# Patient Record
Sex: Male | Born: 1980 | Race: White | Hispanic: No | Marital: Single | Smoking: Current every day smoker
Health system: Southern US, Community
[De-identification: ages and names within clinical notes are randomized; demographics above are authoritative.]

## PROBLEM LIST (undated history)

## (undated) DIAGNOSIS — J45909 Unspecified asthma, uncomplicated: Secondary | ICD-10-CM

## (undated) DIAGNOSIS — F319 Bipolar disorder, unspecified: Secondary | ICD-10-CM

## (undated) DIAGNOSIS — F1910 Other psychoactive substance abuse, uncomplicated: Secondary | ICD-10-CM

## (undated) HISTORY — PX: OTHER SURGICAL HISTORY: SHX169

---
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
## 2002-02-17 ENCOUNTER — Emergency Department (HOSPITAL_COMMUNITY): Admission: EM | Admit: 2002-02-17 | Discharge: 2002-02-17 | Payer: Self-pay | Admitting: Emergency Medicine

## 2002-02-17 ENCOUNTER — Encounter: Payer: Self-pay | Admitting: Emergency Medicine

## 2002-09-16 ENCOUNTER — Emergency Department (HOSPITAL_COMMUNITY): Admission: EM | Admit: 2002-09-16 | Discharge: 2002-09-17 | Payer: Self-pay | Admitting: Emergency Medicine

## 2002-09-22 ENCOUNTER — Emergency Department (HOSPITAL_COMMUNITY): Admission: EM | Admit: 2002-09-22 | Discharge: 2002-09-23 | Payer: Self-pay | Admitting: Emergency Medicine

## 2002-09-22 ENCOUNTER — Encounter: Payer: Self-pay | Admitting: Emergency Medicine

## 2002-10-02 ENCOUNTER — Emergency Department (HOSPITAL_COMMUNITY): Admission: EM | Admit: 2002-10-02 | Discharge: 2002-10-03 | Payer: Self-pay | Admitting: Emergency Medicine

## 2002-10-03 ENCOUNTER — Encounter: Payer: Self-pay | Admitting: Dentistry

## 2003-05-14 ENCOUNTER — Emergency Department (HOSPITAL_COMMUNITY): Admission: AD | Admit: 2003-05-14 | Discharge: 2003-05-14 | Payer: Self-pay | Admitting: Family Medicine

## 2004-04-29 ENCOUNTER — Emergency Department (HOSPITAL_COMMUNITY): Admission: EM | Admit: 2004-04-29 | Discharge: 2004-04-29 | Payer: Self-pay

## 2004-05-18 ENCOUNTER — Ambulatory Visit: Payer: Self-pay | Admitting: Family Medicine

## 2004-08-11 ENCOUNTER — Emergency Department (HOSPITAL_COMMUNITY): Admission: EM | Admit: 2004-08-11 | Discharge: 2004-08-12 | Payer: Self-pay | Admitting: Emergency Medicine

## 2004-12-29 ENCOUNTER — Emergency Department (HOSPITAL_COMMUNITY): Admission: EM | Admit: 2004-12-29 | Discharge: 2004-12-30 | Payer: Self-pay | Admitting: Emergency Medicine

## 2005-01-03 ENCOUNTER — Ambulatory Visit: Payer: Self-pay | Admitting: Family Medicine

## 2005-03-28 ENCOUNTER — Emergency Department (HOSPITAL_COMMUNITY): Admission: EM | Admit: 2005-03-28 | Discharge: 2005-03-28 | Payer: Self-pay | Admitting: Emergency Medicine

## 2005-04-21 ENCOUNTER — Emergency Department (HOSPITAL_COMMUNITY): Admission: EM | Admit: 2005-04-21 | Discharge: 2005-04-21 | Payer: Self-pay | Admitting: Emergency Medicine

## 2006-04-10 ENCOUNTER — Inpatient Hospital Stay (HOSPITAL_COMMUNITY): Admission: RE | Admit: 2006-04-10 | Discharge: 2006-04-14 | Payer: Self-pay | Admitting: Psychiatry

## 2006-04-10 ENCOUNTER — Ambulatory Visit: Payer: Self-pay | Admitting: Psychiatry

## 2010-03-22 ENCOUNTER — Emergency Department (HOSPITAL_COMMUNITY): Admission: EM | Admit: 2010-03-22 | Discharge: 2010-03-22 | Payer: Self-pay | Admitting: Emergency Medicine

## 2010-08-18 LAB — DIFFERENTIAL
Basophils Absolute: 0 10*3/uL (ref 0.0–0.1)
Basophils Relative: 0 % (ref 0–1)
Eosinophils Absolute: 0.1 10*3/uL (ref 0.0–0.7)
Eosinophils Relative: 2 % (ref 0–5)
Lymphocytes Relative: 9 % — ABNORMAL LOW (ref 12–46)
Lymphs Abs: 0.7 10*3/uL (ref 0.7–4.0)
Monocytes Absolute: 0.5 10*3/uL (ref 0.1–1.0)
Monocytes Relative: 7 % (ref 3–12)
Neutro Abs: 6.6 10*3/uL (ref 1.7–7.7)
Neutrophils Relative %: 83 % — ABNORMAL HIGH (ref 43–77)

## 2010-08-18 LAB — CBC
HCT: 40.2 % (ref 39.0–52.0)
Hemoglobin: 14.5 g/dL (ref 13.0–17.0)
MCH: 31 pg (ref 26.0–34.0)
MCHC: 36.1 g/dL — ABNORMAL HIGH (ref 30.0–36.0)
MCV: 86.1 fL (ref 78.0–100.0)
Platelets: 134 10*3/uL — ABNORMAL LOW (ref 150–400)
RBC: 4.67 MIL/uL (ref 4.22–5.81)
RDW: 12.1 % (ref 11.5–15.5)
WBC: 8 10*3/uL (ref 4.0–10.5)

## 2010-08-18 LAB — BASIC METABOLIC PANEL
BUN: 14 mg/dL (ref 6–23)
CO2: 29 mEq/L (ref 19–32)
Calcium: 8.9 mg/dL (ref 8.4–10.5)
Chloride: 104 mEq/L (ref 96–112)
Creatinine, Ser: 0.91 mg/dL (ref 0.4–1.5)
GFR calc Af Amer: >60 mL/min (ref >60)
GFR calc non Af Amer: >60 mL/min (ref >60)
Glucose, Bld: 121 mg/dL — ABNORMAL HIGH (ref 70–99)
Potassium: 3.8 mEq/L (ref 3.5–5.1)
Sodium: 137 mEq/L (ref 135–145)

## 2010-08-18 LAB — RAPID STREP SCREEN (MED CTR MEBANE ONLY): Streptococcus, Group A Screen (Direct): NEGATIVE

## 2010-09-13 ENCOUNTER — Emergency Department (HOSPITAL_COMMUNITY)
Admission: EM | Admit: 2010-09-13 | Discharge: 2010-09-14 | Disposition: A | Discharge status: Home / Self Care | Payer: Self-pay | Attending: Emergency Medicine | Admitting: Emergency Medicine

## 2010-09-13 DIAGNOSIS — R002 Palpitations: Secondary | ICD-10-CM | POA: Insufficient documentation

## 2010-09-13 DIAGNOSIS — F1910 Other psychoactive substance abuse, uncomplicated: Secondary | ICD-10-CM | POA: Insufficient documentation

## 2010-09-13 DIAGNOSIS — R112 Nausea with vomiting, unspecified: Secondary | ICD-10-CM | POA: Insufficient documentation

## 2010-09-13 DIAGNOSIS — R109 Unspecified abdominal pain: Secondary | ICD-10-CM | POA: Insufficient documentation

## 2010-09-13 LAB — CBC
HCT: 48.1 % (ref 39.0–52.0)
Hemoglobin: 17.1 g/dL — ABNORMAL HIGH (ref 13.0–17.0)
MCH: 30.6 pg (ref 26.0–34.0)
MCHC: 35.6 g/dL (ref 30.0–36.0)
MCV: 86.2 fL (ref 78.0–100.0)
Platelets: 214 10*3/uL (ref 150–400)
RBC: 5.58 MIL/uL (ref 4.22–5.81)
RDW: 11.8 % (ref 11.5–15.5)
WBC: 7.9 10*3/uL (ref 4.0–10.5)

## 2010-09-13 LAB — ETHANOL: Alcohol, Ethyl (B): <5 mg/dL (ref 0–10)

## 2010-09-13 LAB — DIFFERENTIAL
Basophils Absolute: 0 10*3/uL (ref 0.0–0.1)
Basophils Relative: 0 % (ref 0–1)
Eosinophils Absolute: 0.1 10*3/uL (ref 0.0–0.7)
Eosinophils Relative: 1 % (ref 0–5)
Lymphocytes Relative: 20 % (ref 12–46)
Lymphs Abs: 1.6 10*3/uL (ref 0.7–4.0)
Monocytes Absolute: 0.4 10*3/uL (ref 0.1–1.0)
Monocytes Relative: 5 % (ref 3–12)
Neutro Abs: 5.8 10*3/uL (ref 1.7–7.7)
Neutrophils Relative %: 74 % (ref 43–77)

## 2010-09-13 LAB — COMPREHENSIVE METABOLIC PANEL
ALT: 38 U/L (ref 0–53)
AST: 28 U/L (ref 0–37)
Albumin: 4.4 g/dL (ref 3.5–5.2)
Alkaline Phosphatase: 78 U/L (ref 39–117)
BUN: 15 mg/dL (ref 6–23)
CO2: 25 mEq/L (ref 19–32)
Calcium: 9.4 mg/dL (ref 8.4–10.5)
Chloride: 104 mEq/L (ref 96–112)
Creatinine, Ser: 0.81 mg/dL (ref 0.4–1.5)
GFR calc Af Amer: >60 mL/min (ref >60)
GFR calc non Af Amer: >60 mL/min (ref >60)
Glucose, Bld: 105 mg/dL — ABNORMAL HIGH (ref 70–99)
Potassium: 4.1 mEq/L (ref 3.5–5.1)
Sodium: 138 mEq/L (ref 135–145)
Total Bilirubin: 0.8 mg/dL (ref 0.3–1.2)
Total Protein: 7.6 g/dL (ref 6.0–8.3)

## 2010-09-13 LAB — URINALYSIS, ROUTINE W REFLEX MICROSCOPIC
Bilirubin Urine: NEGATIVE
Glucose, UA: NEGATIVE mg/dL
Hgb urine dipstick: NEGATIVE
Ketones, ur: NEGATIVE mg/dL
Leukocytes, UA: NEGATIVE
Nitrite: NEGATIVE
Protein, ur: NEGATIVE mg/dL
Specific Gravity, Urine: 1.021 (ref 1.005–1.030)
Urobilinogen, UA: 0.2 mg/dL (ref 0.0–1.0)
pH: 8 (ref 5.0–8.0)

## 2010-09-13 LAB — RAPID URINE DRUG SCREEN, HOSP PERFORMED
Amphetamines: NOT DETECTED
Barbiturates: NOT DETECTED
Benzodiazepines: NOT DETECTED
Cocaine: NOT DETECTED
Opiates: POSITIVE — AB
Tetrahydrocannabinol: POSITIVE — AB

## 2010-09-13 LAB — URINE MICROSCOPIC-ADD ON

## 2010-10-22 NOTE — Discharge Summary (Signed)
NAMETAYVON, CULLEY NO.:  [id number]   MEDICAL RECORD NO.:  [id number]          PATIENT TYPE:  IPS   LOCATION:  0300                          FACILITY:  [facility]   PHYSICIAN:  Anselm Jungling, MD  DATE OF BIRTH:  May 10, 1981   DATE OF ADMISSION:  04/10/2006  DATE OF DISCHARGE:  04/14/2006                               DISCHARGE SUMMARY   IDENTIFYING DATA AND REASON FOR ADMISSION:  The patient is a 30 year old  single white male who was admitted due to severe depression, suicidal  ideation, and self-inflicted cutting.  He reported that he had been  previously diagnosed with personality disorder after having a nervous  breakdown in [place] in the recent past, at which time he had  been placed on a regimen of Seroquel.  He had been using alcohol, his  last drink having been two days prior to admission, and intravenous  heroin which he had used the week prior to admission.  Stressors  included a mother with multiple sclerosis and a girlfriend with cervical  cancer.  Please refer to the admission note for further details  pertaining to the symptoms, circumstances and history that led to his  hospitalization.  He was given initial diagnoses of alcohol and heroin  dependence, mood disorder NOS, rule out substance induced mood disorder,  and history of AXIS II disorder.   MEDICAL AND LABORATORY:  The patient was medically and physically  assessed by the psychiatric nurse practitioner.  He was essentially in  good health, but did report an upper left arm abscess that drained  periodically.  Aside from this, there were no significant medical  issues.  At the time of discharge, she was instructed to follow-up with  his primary care physician regarding wound care and any other health  issues.   HOSPITAL COURSE:  The patient was admitted to the adult inpatient  psychiatric service.  He presented as a well nourished, well developed  male who was pleasant, fully  oriented, with sad mood and affect.  He was  soft spoken, but his thoughts and speech were normally organized.  There  were no signs or symptoms of psychosis or thought disorder.  He denied  suicidal ideation and indicated that he wanted help.  He was continued  on a regimen of Seroquel at a dose of 100 mg at bedtime.  He slept well  with this, and by the third hospital day reported that although he was  feeling better, he was still somewhat depressed.  However, he had more  hope and was able to make more positive statements regarding his  treatment and the prospective of remaining clean and sober.  He focused  well on appropriate aftercare issues.  He agreed to a family session  involving his girlfriend.  This occurred on the fourth hospital day.  His girlfriend expressed concern about the patient's substance abuse,  and she reported that she and their roommates were making changes at  home to accommodate the patient's sobriety.  They discussed the  girlfriend's own medical problems, and discussed the patient's aftercare  needs.  All-in-all, the family session was positive and successful.   The patient's treatment continued until the following day at which time  he appeared to be quite appropriate for discharge.  His mood was much  improved, he was absent of thoughts of suicide or self harm, and was  strongly committed to maintaining his sobriety.   AFTERCARE:  The patient was to follow-up at the [facility] with an  appointment for medication management on April 18, 2006.  He was also  instructed to go to [facility] for an appointment  for intake on November 30.  He was instructed to follow-up with his  primary care physician regarding his skin wound and other health issues.   DISCHARGE MEDICATIONS:  Depakote ER 500 mg daily, which had been  initiated for mood stabilization, and Seroquel 100 mg q.h.s.   DISCHARGE DIAGNOSES:  AXIS I:              Polysubstance  abuse/dependence.                    Mood disorder NOS.  AXIS II:          Deferred.  AXIS III:         Skin abscess.  AXIS IV:          Stressors severe.  AXIS V:           GAF on discharge 65.      Anselm Jungling, MD  Electronically Signed     SPB/MEDQ  D:  05/17/2006  T:  05/17/2006  Job:  [id number]

## 2010-10-22 NOTE — H&P (Signed)
Mark Hunt, BATREZ NO.:  [id number]   MEDICAL RECORD NO.:  [id number]          PATIENT TYPE:  IPS   LOCATION:  0300                          FACILITY:  [facility]   PHYSICIAN:  Anselm Jungling, MD  DATE OF BIRTH:  01-26-1981   DATE OF ADMISSION:  04/10/2006  DATE OF DISCHARGE:                         PSYCHIATRIC ADMISSION ASSESSMENT   A 30 year old single white male, voluntarily admitted on April 10, 2006.   HISTORY OF PRESENT ILLNESS:  The patient presents with a history of self-  inflicted injury, suicidal thoughts.  Cut his left arm with a razor.  Patient also reports he has been drinking and using heroin IV.  His last use  of alcohol and drugs were on Saturday prior to this admission.  Patient  states that he considers himself a vagabond.  He has been living in  different surroundings.  Currently living with friends at this time.  Endorsing mood swings and racing thoughts, and feeling confused at times.  Patient also reports that he has been having some stressors, stating death  is all around him.  His girlfriend is currently ill with cervical cancer.  Patient denies any psychosis and any current withdrawal symptoms.   PAST PSYCHIATRIC HISTORY:  First admission to [facility].  Has  a history of cutting.  About 2 months ago was hospitalized in [place].   SOCIAL HISTORY:  He is a 30 year old single white male with no children,  currently living with friends.  He is unemployed and denies any legal  issues.   FAMILY HISTORY:  States his mother has mental illness, and there are also  mood disorders in the family.   ALCOHOL/DRUG HISTORY:  Patient smokes a pack a day.  He has been drinking  wine and using IV heroin.   PRIMARY CARE Mackenzy Grumbine:  None.   MEDICAL PROBLEMS:  None, although patient does report an abscess on his left  upper arm that drains periodically.   MEDICATIONS:  Seroquel 300 mg at bedtime, has been noncompliant.   DRUG ALLERGIES:  No known allergies.   REVIEW OF SYSTEMS:  Patient denies any fever, chills or changes in appetite.  Reports shortness of breath.  Smokes.  Denies any chest pain, nausea or  vomiting, or diarrhea.  SKIN:  An abscess to his left upper arm.  Reporting  depression with suicidal thoughts, with recent substance abuse.  Temperature  is 97.1, __________, blood pressure 139/80, 184 pounds.  He is 6 feet 3  inches tall.  Just a well-nourished young man, no acute distress.  Negative  lymphadenopathy.  CHEST:  Clear.  There is no wheezing.  HEART :  Rate is  regular rate and rhythm.  No murmurs, gallops, or rubs.  ABDOMEN :  Soft,  flat, nontender.  ABDOMEN/GU:  Exam is deferred.  EXTREMITIES:  The patient  moves all extremities __________.  No edema.  No clubbing.  SKIN:  The  patient has multiple tattoos to his hands and wrist.  Has multiple  superficial lacerations to his left forearm.  Also has an approximately  quarter-size area  to his left inner axilla area, non-draining, pink.  Does  not look overtly inflamed.  NEUROLOGIC:  Findings are intact and nonfocal.   Hemoglobin 17.4, hematocrit 49.5.  TSH 0.722.  CMET is within normal limits.  Urine drug screen is unavailable at this time.   MENTAL STATUS EXAMINATION:  He is fully alert, cooperative, casually  dressed.  Little eye contact.  Looks down at the floor.  Brief eye contact.  Speech is soft spoken.  Patient feels anxious and tense, and depressed.  Patient's affect is flat.  Thought process is somewhat scattered.  There,  however, does not appear to be any evidence of psychosis, endorsing suicidal  thoughts.  Cognitive function intact.  Memory is fair.  Judgment is poor,  poor historian.   Axis I:  Mood disorder, polysubstance abuse.  Axis II:  Deferred.  Axis III:  Tobacco abuse, and a lesion to left arm.  Axis IV:  Problems with primary support group, occupation, housing, and  other significant problems.  Axis V:   Current is 35.   __________ contract for safety.  We will review medication compliance.  We  will do workup in regards to IV drug use.  Check an HIV and hepatitis.  Do a  wound culture on lesion on arm.  We will continue with Seroquel and Depakote  for mood swings, anxiety.  Case manager is to address living situation.  We  will consider a family session with patient's support group.  Patient will  need to follow up with lesion on arm.  Tentative stay 5 to 6 days.      Landry Corporal, N.P.      Anselm Jungling, MD  Electronically Signed    JO/MEDQ  D:  04/12/2006  T:  04/12/2006  Job:  [id number]

## 2011-10-27 ENCOUNTER — Emergency Department (HOSPITAL_COMMUNITY): Payer: Self-pay

## 2011-10-27 ENCOUNTER — Emergency Department (HOSPITAL_COMMUNITY)
Admission: EM | Admit: 2011-10-27 | Discharge: 2011-10-27 | Disposition: A | Discharge status: Home / Self Care | Payer: Self-pay | Attending: Emergency Medicine | Admitting: Emergency Medicine

## 2011-10-27 ENCOUNTER — Encounter (HOSPITAL_COMMUNITY): Payer: Self-pay | Admitting: *Deleted

## 2011-10-27 DIAGNOSIS — IMO0001 Reserved for inherently not codable concepts without codable children: Secondary | ICD-10-CM | POA: Insufficient documentation

## 2011-10-27 DIAGNOSIS — J40 Bronchitis, not specified as acute or chronic: Secondary | ICD-10-CM | POA: Insufficient documentation

## 2011-10-27 MED ORDER — ALBUTEROL SULFATE (5 MG/ML) 0.5% IN NEBU
5.0000 mg | INHALATION_SOLUTION | Freq: Once | RESPIRATORY_TRACT | Status: AC
  Administered 2011-10-27: 5 mg via RESPIRATORY_TRACT
  Filled 2011-10-27: qty 1

## 2011-10-27 MED ORDER — ALBUTEROL SULFATE HFA 108 (90 BASE) MCG/ACT IN AERS
2.0000 | INHALATION_SPRAY | RESPIRATORY_TRACT | Status: DC | PRN
  Administered 2011-10-27: 2 via RESPIRATORY_TRACT
  Filled 2011-10-27: qty 6.7

## 2011-10-27 MED ORDER — ACETAMINOPHEN 325 MG PO TABS
975.0000 mg | ORAL_TABLET | Freq: Once | ORAL | Status: AC
  Administered 2011-10-27: 975 mg via ORAL
  Filled 2011-10-27: qty 3

## 2011-10-27 NOTE — Discharge Instructions (Signed)
Bronchitis Stop smoking, as smoking makes her prone to bronchitis. Use your inhaler 2 puffs every 4 hours as needed for shortness of breath or cough. Take Tylenol for aches. Call [facility] tomorrow to get a primary care Doctor Bronchitis is a problem of the air tubes leading to your lungs. This problem makes it hard for air to get in and out of the lungs. You may cough a lot because your air tubes are narrow. Going without care can cause lasting (chronic) bronchitis. HOME CARE   Drink enough fluids to keep your pee (urine) clear or pale yellow.   Use a cool mist humidifier.   Quit smoking if you smoke. If you keep smoking, the bronchitis might not get better.   Only take medicine as told by your doctor.  GET HELP RIGHT AWAY IF:   Coughing keeps you awake.   You start to wheeze.   You become more sick or weak.   You have a hard time breathing or get short of breath.   You cough up blood.   Coughing lasts more than 2 weeks.   You have a fever.   Your baby is older than 3 months with a rectal temperature of 102 F (38.9 C) or higher.   Your baby is 37 months old or younger with a rectal temperature of 100.4 F (38 C) or higher.  MAKE SURE YOU:  Understand these instructions.   Will watch your condition.   Will get help right away if you are not doing well or get worse.  Document Released: 11/09/2007 Document Revised: 05/12/2011 Document Reviewed: 04/24/2009 [facility] Patient Information 2012 [place], [place].

## 2011-10-27 NOTE — ED Notes (Signed)
Pt is here with head adn chest congestion and right ear fullness.  Pt reports fatigue.  Been going on for weeks

## 2011-10-27 NOTE — ED Provider Notes (Addendum)
History  This chart was scribed for Doug Sou, MD by Bennett Scrape. This patient was seen in room STRE6/STRE6 and the patient's care was started at 3:46PM.  CSN: [id number]  Arrival date & time 10/27/11  1409   First MD Initiated Contact with Patient 10/27/11 1546      Chief Complaint  Patient presents with  . Cough  . Generalized Body Aches    Patient is a 31 y.o. male presenting with cough. The history is provided by the patient. No language interpreter was used.  Cough This is a new problem. The current episode started more than 1 week ago. The problem occurs constantly. The problem has not changed since onset.The cough is non-productive. There has been no fever. Associated symptoms include sore throat and shortness of breath. Pertinent negatives include no chills and no ear pain. He has tried nothing for the symptoms. He is a smoker. His past medical history is significant for asthma.     Mark Hunt is a 31 y.o. male who presents to the Emergency Department complaining of 2 weeks gradual onset, constant non-productive cough with associated generalized body aches, chest congestion, sore throat and emesis. Emesis is worse at night.  He reports that last episode of emesis being two days ago. The sore throat is worse with coughing. Pt reports that he is currently SOB. He reports taking ibuprofen and mucinex with mild improvement in symptoms. He denies fever, abdominal pain and diarrhea as associated symptoms. He has a h/o asthma. He is a current everyday smoker but denies alcohol use.  No PCP.    Past medical history: Asthma-Per pt by bedside  History reviewed. No pertinent past surgical history.  No family history on file.  History  Substance Use Topics  . Smoking status: Current Everyday Smoker-E cigarettes and marijuana occasioanlly  . Smokeless tobacco: Not on file  . Alcohol Use: No      Review of Systems  Constitutional: Negative for fever and chills.  HENT:  Positive for congestion and sore throat. Negative for ear pain.   Respiratory: Positive for cough and shortness of breath.   Gastrointestinal: Positive for nausea and vomiting. Negative for abdominal pain and diarrhea.  All other systems reviewed and are negative.    Allergies  Review of patient's allergies indicates no known allergies.  Home Medications   Current Outpatient Rx  Name Route Sig Dispense Refill  . GUAIFENESIN ER 600 MG PO TB12 Oral Take 600 mg by mouth once.    . ADULT MULTIVITAMIN W/MINERALS CH Oral Take 1 tablet by mouth daily.      Triage Vitals: BP 111/76  Pulse 90  Temp(Src) 98.3 F (36.8 C) (Oral)  Resp 16  SpO2 96%  Physical Exam  Nursing note and vitals reviewed. Constitutional: He is oriented to person, place, and time. He appears well-developed and well-nourished. No distress.  HENT:  Head: Normocephalic and atraumatic.       Nasal congestion, oropharynx is erythematous, TMs are normal bilaterally, uvula is midline  Eyes: EOM are normal.  Neck: Neck supple. No tracheal deviation present.  Cardiovascular: Normal rate.   Pulmonary/Chest: Effort normal. No respiratory distress. He has no wheezes. He has no rales.       Diffuse scant rhonchi  Abdominal: Soft. There is no tenderness.  Musculoskeletal: Normal range of motion.  Neurological: He is alert and oriented to person, place, and time.  Skin: Skin is warm and dry.  Psychiatric: He has a normal mood and affect. His  behavior is normal.    ED Course  Procedures (including critical care time)  DIAGNOSTIC STUDIES: Oxygen Saturation is 96% on room air, adequate by my interpretation.   Breathing improved after treatment with albuterol nebulizer. At 5 PM patient sitting up in bed reading a book, no distress COORDINATION OF CARE: 3:53PM-Discussed treatment plan which includes tylenol and breathing treatment with pt and pt agreed to plan. 5:01PM-Discussed discharge plan with pt and pt agreed. Advised  pt to quit smoking. Will proved a referral to a PCP in the [place] area for the pt to follow up with.   Labs Reviewed - No data to display Dg Chest 2 View  10/27/2011  *RADIOLOGY REPORT*  Clinical Data: Chest pain and cough.  Myalgias.  Smoker.  Asthma.  CHEST - 2 VIEW  Comparison: 03/22/2010.  Findings: Normal sized heart.  Clear lungs.  Stable mild central peribronchial thickening.  Unremarkable bones.  IMPRESSION: Stable mild chronic bronchitic changes.  No acute abnormality.  Original Report Authenticated By: Darrol Gaudin, M.D.   X-ray reviewed by me  No diagnosis found.    MDM  Plan smoking cessation encouraged(spent 5 minutes counseling patient on benefits of smoking cessation) Albuterol HFA to go Referral [facility] Tylenol for aches Diagnosis #1 bronchitis #2 tobacco abuse       I personally performed the services described in this documentation, which was scribed in my presence. The recorded information has been reviewed and considered.   Doug Sou, MD 10/27/11 1708  Doug Sou, MD 10/27/11 1710

## 2011-10-27 NOTE — ED Notes (Signed)
St's feels better after breathing tx

## 2012-09-06 ENCOUNTER — Encounter (HOSPITAL_COMMUNITY): Payer: Self-pay | Admitting: *Deleted

## 2012-09-06 ENCOUNTER — Emergency Department (HOSPITAL_COMMUNITY)
Admission: EM | Admit: 2012-09-06 | Discharge: 2012-09-06 | Disposition: A | Discharge status: Home / Self Care | Payer: Self-pay | Attending: Emergency Medicine | Admitting: Emergency Medicine

## 2012-09-06 DIAGNOSIS — K0889 Other specified disorders of teeth and supporting structures: Secondary | ICD-10-CM

## 2012-09-06 DIAGNOSIS — F17200 Nicotine dependence, unspecified, uncomplicated: Secondary | ICD-10-CM | POA: Insufficient documentation

## 2012-09-06 DIAGNOSIS — R748 Abnormal levels of other serum enzymes: Secondary | ICD-10-CM

## 2012-09-06 DIAGNOSIS — R599 Enlarged lymph nodes, unspecified: Secondary | ICD-10-CM | POA: Insufficient documentation

## 2012-09-06 DIAGNOSIS — K089 Disorder of teeth and supporting structures, unspecified: Secondary | ICD-10-CM | POA: Insufficient documentation

## 2012-09-06 DIAGNOSIS — F319 Bipolar disorder, unspecified: Secondary | ICD-10-CM | POA: Insufficient documentation

## 2012-09-06 DIAGNOSIS — J351 Hypertrophy of tonsils: Secondary | ICD-10-CM | POA: Insufficient documentation

## 2012-09-06 DIAGNOSIS — Z79899 Other long term (current) drug therapy: Secondary | ICD-10-CM | POA: Insufficient documentation

## 2012-09-06 DIAGNOSIS — H9209 Otalgia, unspecified ear: Secondary | ICD-10-CM | POA: Insufficient documentation

## 2012-09-06 DIAGNOSIS — K006 Disturbances in tooth eruption: Secondary | ICD-10-CM | POA: Insufficient documentation

## 2012-09-06 HISTORY — DX: Bipolar disorder, unspecified: F31.9

## 2012-09-06 LAB — COMPREHENSIVE METABOLIC PANEL WITH GFR
ALT: 92 U/L — ABNORMAL HIGH (ref 0–53)
BUN: 17 mg/dL (ref 6–23)
CO2: 28 meq/L (ref 19–32)
Calcium: 9.4 mg/dL (ref 8.4–10.5)
Creatinine, Ser: 0.8 mg/dL (ref 0.50–1.35)
GFR calc Af Amer: >90 mL/min (ref >90)
GFR calc non Af Amer: >90 mL/min (ref >90)
Glucose, Bld: 101 mg/dL — ABNORMAL HIGH (ref 70–99)

## 2012-09-06 LAB — COMPREHENSIVE METABOLIC PANEL
AST: 70 U/L — ABNORMAL HIGH (ref 0–37)
Albumin: 3.7 g/dL (ref 3.5–5.2)
Alkaline Phosphatase: 77 U/L (ref 39–117)
Chloride: 98 mEq/L (ref 96–112)
Potassium: 4 mEq/L (ref 3.5–5.1)
Sodium: 135 mEq/L (ref 135–145)
Total Bilirubin: 0.3 mg/dL (ref 0.3–1.2)
Total Protein: 7.3 g/dL (ref 6.0–8.3)

## 2012-09-06 LAB — CBC
HCT: 46.8 % (ref 39.0–52.0)
Hemoglobin: 16.6 g/dL (ref 13.0–17.0)
MCH: 30.1 pg (ref 26.0–34.0)
MCHC: 35.5 g/dL (ref 30.0–36.0)
MCV: 84.9 fL (ref 78.0–100.0)
Platelets: 186 10*3/uL (ref 150–400)
RBC: 5.51 MIL/uL (ref 4.22–5.81)
RDW: 12.7 % (ref 11.5–15.5)
WBC: 6.5 10*3/uL (ref 4.0–10.5)

## 2012-09-06 MED ORDER — PENICILLIN V POTASSIUM 500 MG PO TABS: 500.0000 mg | ORAL_TABLET | Freq: Four times a day (QID) | ORAL | Status: DC

## 2012-09-06 MED ORDER — TRAMADOL HCL 50 MG PO TABS
50.0000 mg | ORAL_TABLET | Freq: Once | ORAL | Status: AC
  Administered 2012-09-06: 50 mg via ORAL
  Filled 2012-09-06: qty 1

## 2012-09-06 MED ORDER — TRAMADOL HCL 50 MG PO TABS: 50.0000 mg | ORAL_TABLET | Freq: Four times a day (QID) | ORAL | Status: DC | PRN

## 2012-09-06 NOTE — Discharge Instructions (Signed)

## 2012-09-06 NOTE — ED Provider Notes (Signed)
Medical screening examination/treatment/procedure(s) were performed by non-physician practitioner and as supervising physician I was immediately available for consultation/collaboration.   Mark Hunt. Oletta Lamas, MD 09/06/12 2215

## 2012-09-06 NOTE — ED Provider Notes (Signed)
History    This chart was scribed for non-physician practitioner working with Mark Hunt. Mark Lamas, MD by Mark Hunt, ED Scribe. This patient was seen in room WLCON/WLCON and the patient's care was started at 4:17PM.   CSN: [id number]  Arrival date & time 09/06/12  1602   First MD Initiated Contact with Patient 09/06/12 1617      Chief Complaint  Patient presents with  . Medical Clearance  . Dental Pain    (Consider location/radiation/quality/duration/timing/severity/associated sxs/prior treatment) Patient is a 32 y.o. male presenting with tooth pain. The history is provided by the patient. No language interpreter was used.  Dental PainThe primary symptoms include mouth pain. Primary symptoms do not include fever, shortness of breath or sore throat. The symptoms began more than 1 week ago (2 weeks ago). The symptoms are worsening. The symptoms are new. The symptoms occur constantly.  Mouth pain began more than 1 week (2 weeks ago) ago. Mouth pain occurs constantly. Mouth pain is worsening. Affected locations include: teeth.  Additional symptoms include: dental sensitivity to temperature and ear pain.    Mark Hunt is a 32 y.o. male , with a hx of bipolar 1 disorder, who presents to the Emergency Department complaining of dental pain located at the left lower jaw, radiating downwards, onset two weeks ago. The pt reports he has been experiencing intermittently occuring, dental pain, located at the left lower jaw, for the past two weeks. Associated symptoms include otalgia located at the right ear, onset two weeks ago. The pt characterizes his dental pain as a throbbing sensation, intensified by exposure to cold liquids.  The pt denies fever, abdominal pain, nausea, vomiting, chest pain, and difficulty breathing.   The pt is a current everyday smoker, however, he does not drink alcohol.      Past Medical History  Diagnosis Date  . Bipolar 1 disorder     History reviewed. No pertinent past  surgical history.  No family history on file.  History  Substance Use Topics  . Smoking status: Current Every Day Smoker  . Smokeless tobacco: Not on file  . Alcohol Use: No      Review of Systems  Constitutional: Negative for fever.  HENT: Positive for ear pain and dental problem. Negative for sore throat.   Respiratory: Negative for shortness of breath.   Cardiovascular: Negative for chest pain.  Gastrointestinal: Negative for nausea, vomiting and abdominal pain.  All other systems reviewed and are negative.    Allergies  Review of patient's allergies indicates no known allergies.  Home Medications   Current Outpatient Rx  Name  Route  Sig  Dispense  Refill  . ibuprofen (ADVIL,MOTRIN) 200 MG tablet   Oral   Take 600 mg by mouth every 8 (eight) hours as needed for pain.         . Multiple Vitamin (MULITIVITAMIN WITH MINERALS) TABS   Oral   Take 1 tablet by mouth daily.         . Pseudoephedrine-APAP-DM (DAYQUIL PO)   Oral   Take 15 mLs by mouth every 6 (six) hours as needed (for cold symptoms.).         Marland Kitchen QUEtiapine (SEROQUEL) 200 MG tablet   Oral   Take 200 mg by mouth at bedtime.           BP 130/72  Pulse 84  Temp(Src) 98.7 F (37.1 C) (Oral)  Resp 16  SpO2 98%  Physical Exam  Nursing note and vitals reviewed. Constitutional:  He is oriented to person, place, and time. He appears well-developed and well-nourished. No distress.  HENT:  Head: Normocephalic and atraumatic. No trismus in the jaw.  Right Ear: Hearing, tympanic membrane, external ear and ear canal normal.  Left Ear: Hearing, tympanic membrane, external ear and ear canal normal.  Nose: Nose normal.  Mouth/Throat: Abnormal dentition. No dental abscesses.  Missing half of left lower molar. Gum inflamed, Tender to palpitation. Right tonsil markedly enlarged.  around the missing molar, no obvious signs of abscess, no submental edema, tongue elevation, nor trismus.   Eyes: Conjunctivae  are normal.  Neck: Normal range of motion. No tracheal deviation present.  Cardiovascular: Normal rate, regular rhythm and normal heart sounds.   Pulmonary/Chest: Effort normal and breath sounds normal. No stridor.  Abdominal: Soft. He exhibits no distension. There is no tenderness.  Musculoskeletal: Normal range of motion.  Lymphadenopathy:    He has cervical adenopathy.  Neurological: He is alert and oriented to person, place, and time.  Skin: Skin is warm and dry. He is not diaphoretic.  Psychiatric: His behavior is normal. His affect is blunt.    ED Course  Procedures (including critical care time)  DIAGNOSTIC STUDIES: Oxygen Saturation is 98% on room air, normal by my interpretation.    COORDINATION OF CARE:  4:22 PM- Treatment plan concerning evaluation by attending Physician and follow up with Dentist discussed with patient. Pt agrees with treatment.   4:44 PM- Recheck. Treatment plan concerning laboratory evaluation, administration of penicillin, and follow up with a dentist discussed with patient. Pt agrees with treatment.     Results for orders placed during the hospital encounter of 09/06/12  CBC      Result Value Range   WBC 6.5  4.0 - 10.5 K/uL   RBC 5.51  4.22 - 5.81 MIL/uL   Hemoglobin 16.6  13.0 - 17.0 g/dL   HCT 40.9  81.1 - 91.4 %   MCV 84.9  78.0 - 100.0 fL   MCH 30.1  26.0 - 34.0 pg   MCHC 35.5  30.0 - 36.0 g/dL   RDW 78.2  95.6 - 21.3 %   Platelets 186  150 - 400 K/uL  COMPREHENSIVE METABOLIC PANEL      Result Value Range   Sodium 135  135 - 145 mEq/L   Potassium 4.0  3.5 - 5.1 mEq/L   Chloride 98  96 - 112 mEq/L   CO2 28  19 - 32 mEq/L   Glucose, Bld 101 (*) 70 - 99 mg/dL   BUN 17  6 - 23 mg/dL   Creatinine, Ser 0.86  0.50 - 1.35 mg/dL   Calcium 9.4  8.4 - 57.8 mg/dL   Total Protein 7.3  6.0 - 8.3 g/dL   Albumin 3.7  3.5 - 5.2 g/dL   AST 70 (*) 0 - 37 U/L   ALT 92 (*) 0 - 53 U/L   Alkaline Phosphatase 77  39 - 117 U/L   Total Bilirubin 0.3   0.3 - 1.2 mg/dL   GFR calc non Af Amer >90  >90 mL/min   GFR calc Af Amer >90  >90 mL/min      No results found.   1. Pain, dental   2. Elevated liver enzymes       MDM  Patient presents today from [facility] with worsening dental pain over 2 weeks. Missing half his left lower molar with left lower gum irritation. No obvious dental abscess. States he lost half his  tooth 3 years ago. Markedly enlarged right tonsil. States this is also chronic. No signs of impending airway obstruction. Covered with penicillin. Follow up with dentist - referral to dentist on call given. Mild elevation of liver enzymes. This can be followed up and does not need to be addressed today. Vitals signs stable for discharge. Will return to [facility]. Return precautions given.      I personally performed the services described in this documentation, which was scribed in my presence. The recorded information has been reviewed and is accurate.   Mora Bellman, PA-C 09/06/12 1810

## 2012-09-06 NOTE — Progress Notes (Signed)
CM spoke with pt who confirms self pay [facility] resident with no pcp. Pending medical clearance

## 2012-09-06 NOTE — ED Notes (Signed)
Pt sent from [facility] d/t tooth pain radiating through mouth and down jaw x 2 weeks, states [facility] does have common antibiotics and pain medications, also requesting pt have chem panel and CBC. Pt to return to [facility] once seen.

## 2012-09-16 ENCOUNTER — Emergency Department (HOSPITAL_COMMUNITY): Payer: Self-pay

## 2012-09-16 ENCOUNTER — Emergency Department (HOSPITAL_COMMUNITY)
Admission: EM | Admit: 2012-09-16 | Discharge: 2012-09-16 | Disposition: A | Discharge status: Home / Self Care | Payer: Self-pay | Attending: Emergency Medicine | Admitting: Emergency Medicine

## 2012-09-16 ENCOUNTER — Encounter (HOSPITAL_COMMUNITY): Payer: Self-pay | Admitting: *Deleted

## 2012-09-16 DIAGNOSIS — Z792 Long term (current) use of antibiotics: Secondary | ICD-10-CM | POA: Insufficient documentation

## 2012-09-16 DIAGNOSIS — H53149 Visual discomfort, unspecified: Secondary | ICD-10-CM | POA: Insufficient documentation

## 2012-09-16 DIAGNOSIS — Z79899 Other long term (current) drug therapy: Secondary | ICD-10-CM | POA: Insufficient documentation

## 2012-09-16 DIAGNOSIS — Z5900 Homelessness unspecified: Secondary | ICD-10-CM | POA: Insufficient documentation

## 2012-09-16 DIAGNOSIS — F319 Bipolar disorder, unspecified: Secondary | ICD-10-CM | POA: Insufficient documentation

## 2012-09-16 DIAGNOSIS — R51 Headache: Secondary | ICD-10-CM | POA: Insufficient documentation

## 2012-09-16 DIAGNOSIS — F17200 Nicotine dependence, unspecified, uncomplicated: Secondary | ICD-10-CM | POA: Insufficient documentation

## 2012-09-16 DIAGNOSIS — H538 Other visual disturbances: Secondary | ICD-10-CM | POA: Insufficient documentation

## 2012-09-16 HISTORY — DX: Other psychoactive substance abuse, uncomplicated: F19.10

## 2012-09-16 MED ORDER — KETOROLAC TROMETHAMINE 30 MG/ML IJ SOLN
30.0000 mg | Freq: Once | INTRAMUSCULAR | Status: AC
  Administered 2012-09-16: 30 mg via INTRAVENOUS
  Filled 2012-09-16: qty 1

## 2012-09-16 MED ORDER — DIPHENHYDRAMINE HCL 50 MG/ML IJ SOLN
25.0000 mg | Freq: Once | INTRAMUSCULAR | Status: AC
  Administered 2012-09-16: 25 mg via INTRAVENOUS
  Filled 2012-09-16: qty 1

## 2012-09-16 MED ORDER — METOCLOPRAMIDE HCL 5 MG/ML IJ SOLN
10.0000 mg | Freq: Once | INTRAMUSCULAR | Status: AC
  Administered 2012-09-16: 10 mg via INTRAVENOUS
  Filled 2012-09-16: qty 2

## 2012-09-16 MED ORDER — SODIUM CHLORIDE 0.9 % IV BOLUS (SEPSIS)
1000.0000 mL | Freq: Once | INTRAVENOUS | Status: AC
  Administered 2012-09-16: 1000 mL via INTRAVENOUS

## 2012-09-16 NOTE — ED Notes (Signed)
The pt returned from [facility] iv started and med given.  Headache sl better when the med had just been gien and  Going to sleep

## 2012-09-16 NOTE — ED Notes (Signed)
[place] and he is c/o a headache for3 days.  He had a rx for tramadol but that is gone

## 2012-09-16 NOTE — ED Provider Notes (Signed)
History     CSN: [id number]  Arrival date & time 09/16/12  1633   First MD Initiated Contact with Patient 09/16/12 1637      No chief complaint on file.   (Consider location/radiation/quality/duration/timing/severity/associated sxs/prior treatment) Patient is a 32 y.o. male presenting with headaches. The history is provided by the patient.  Headache Pain location:  Frontal Quality: pulsating. Radiates to:  Does not radiate Severity currently:  10/10 Severity at highest:  10/10 Onset quality:  Sudden Duration:  1 hour Timing:  Constant Progression:  Unchanged Chronicity:  New Similar to prior headaches: yes (but hasn't had migraines since he was a child)   Ineffective treatments:  None tried Associated symptoms: blurred vision (mild, at onset, improving) and photophobia   Associated symptoms: no abdominal pain, no back pain, no congestion, no cough, no dizziness, no facial pain, no fever, no focal weakness, no loss of balance, no nausea, no neck pain, no neck stiffness, no numbness, no paresthesias, no sinus pressure, no sore throat, no syncope, no tingling, no URI, no visual change, no vomiting and no weakness     Past Medical History  Diagnosis Date  . Bipolar 1 disorder     History reviewed. No pertinent past surgical history.  No family history on file.  History  Substance Use Topics  . Smoking status: Current Every Day Smoker  . Smokeless tobacco: Not on file  . Alcohol Use: No      Review of Systems  Constitutional: Negative for fever.  HENT: Negative for congestion, sore throat, neck pain, neck stiffness and sinus pressure.   Eyes: Positive for blurred vision (mild, at onset, improving) and photophobia.  Respiratory: Negative for cough.   Cardiovascular: Negative for syncope.  Gastrointestinal: Negative for nausea, vomiting and abdominal pain.  Musculoskeletal: Negative for back pain.  Neurological: Positive for headaches. Negative for dizziness, focal  weakness, numbness, paresthesias and loss of balance.  All other systems reviewed and are negative.    Allergies  Review of patient's allergies indicates no known allergies.  Home Medications   Current Outpatient Rx  Name  Route  Sig  Dispense  Refill  . ibuprofen (ADVIL,MOTRIN) 200 MG tablet   Oral   Take 600 mg by mouth every 8 (eight) hours as needed for pain.         Marland Kitchen penicillin v potassium (VEETID) 500 MG tablet   Oral   Take 1 tablet (500 mg total) by mouth 4 (four) times daily.   20 tablet   0   . QUEtiapine (SEROQUEL) 200 MG tablet   Oral   Take 200 mg by mouth at bedtime.         . traMADol (ULTRAM) 50 MG tablet   Oral   Take 1 tablet (50 mg total) by mouth every 6 (six) hours as needed for pain.   15 tablet   0     BP 142/79  Pulse 97  Temp(Src) 98.1 F (36.7 C) (Oral)  Resp 18  SpO2 96%  Physical Exam  Vitals reviewed. Constitutional: He is oriented to person, place, and time. He appears well-developed and well-nourished. No distress.  HENT:  Head: Normocephalic.  Right Ear: External ear normal.  Left Ear: External ear normal.  Nose: Nose normal.  Mouth/Throat: Oropharynx is clear and moist. No oropharyngeal exudate.  Eyes: Conjunctivae and EOM are normal. Pupils are equal, round, and reactive to light.  Neck: Normal range of motion. Neck supple. No Brudzinski's sign and no Kernig's sign  noted.  Cardiovascular: Normal rate, regular rhythm, normal heart sounds and intact distal pulses.  Exam reveals no gallop and no friction rub.   No murmur heard. Pulmonary/Chest: Effort normal and breath sounds normal.  Abdominal: Soft. Bowel sounds are normal. He exhibits no distension. There is no tenderness.  Musculoskeletal: Normal range of motion. He exhibits no edema and no tenderness.  Track marks left a/c fossa  Neurological: He is alert and oriented to person, place, and time. He has normal strength and normal reflexes. No cranial nerve deficit or  sensory deficit. Coordination normal.  Skin: Skin is warm and dry.  Psychiatric: He has a normal mood and affect.    ED Course  Procedures (including critical care time)  Labs Reviewed - No data to display Ct Head Wo Contrast  09/16/2012  *RADIOLOGY REPORT*  Clinical Data: Headache.  CT HEAD WITHOUT CONTRAST  Technique:  Contiguous axial images were obtained from the base of the skull through the vertex without contrast.  Comparison: None.  Findings: The brain appears normal without infarct, hemorrhage, mass lesion, mass effect, midline shift or abnormal extra-axial fluid collection.  Very mild ethmoid air cell disease is noted.  No pneumocephalus or hydrocephalus.  Calvarium intact.  IMPRESSION: Negative exam.   Original Report Authenticated By: Holley Dexter, M.D.      1. Headache       MDM   60 y M with PMH of Bipolar 1 and IVDA here for eval 2/2 sudden onset, severe HA approx 1 hr PTA.  +photophobia and mild blurry vision that is improving.  No diplopia, vomiting, neck stiffness, recent illness.  AFVSS.  Well appearing but deisres to keep eyes covered.  No focal neurologic deficits.  Track marks left A/C.  No meningeal signs.  Diff Dx: Migraine, SAH/CVA, Meningitis, Encephalitis, AAG, Tension HA, Cluster HA.  Doubt infectious cause at this time as no meningeal signs and afebrile.  [place] CT head, NS bolus, reglan/benadryl.  7:15 PM CT head negative.  HA almost gone.  Pt reports feeling better and is ready to go home.  Will administer Toradol and d/c home.  Return precautions reviewed.  It is felt the pt is stable for d/c with close PCP f/u.  All questions answered and patient expressed understanding.  Disposition: Discharge  Condition: Good  Follow-up Information   Follow up with Follow up with your regular doctor..       Pt seen in conjunction with my attending, Dr.Kohut .   Oleh Genin, MD PGY-II [facility] Emergency Medicine Resident        Oleh Genin,  MD 09/17/12 [phone]

## 2012-09-16 NOTE — ED Notes (Signed)
Pt to xray

## 2012-09-16 NOTE — ED Notes (Signed)
The pt is reporting now that he has had a headache for 1-2 hours and does not usually have headaches.  Pt c/o nausea

## 2012-09-16 NOTE — ED Notes (Signed)
The pt reports that his headache is better.  He is still keeping his eyes and head covered against the light

## 2012-09-17 ENCOUNTER — Encounter (HOSPITAL_COMMUNITY): Payer: Self-pay | Admitting: Emergency Medicine

## 2012-09-19 NOTE — ED Provider Notes (Signed)
I saw and evaluated the patient, reviewed the resident's note and I agree with the findings and plan.  32 year old male with atraumatic headache. Nonfocal neurological examination. Patient has significant headache history and relatively acute onset. Some concern for subarachnoid hemorrhage. CT imaging of the head is negative. CT was performed in 6 hours of symptom onset.  Treated symptomatically w/ much improved symptoms. Return precautions discussed. Outpatient followup as needed otherwise.   Raeford Razor, MD 09/19/12 1550

## 2013-01-25 ENCOUNTER — Emergency Department (HOSPITAL_COMMUNITY)
Admission: EM | Admit: 2013-01-25 | Discharge: 2013-01-25 | Disposition: A | Discharge status: Home / Self Care | Payer: Self-pay | Attending: Emergency Medicine | Admitting: Emergency Medicine

## 2013-01-25 ENCOUNTER — Encounter (HOSPITAL_COMMUNITY): Payer: Self-pay

## 2013-01-25 ENCOUNTER — Emergency Department (HOSPITAL_COMMUNITY): Payer: Self-pay

## 2013-01-25 DIAGNOSIS — F17200 Nicotine dependence, unspecified, uncomplicated: Secondary | ICD-10-CM | POA: Insufficient documentation

## 2013-01-25 DIAGNOSIS — Z8659 Personal history of other mental and behavioral disorders: Secondary | ICD-10-CM | POA: Insufficient documentation

## 2013-01-25 DIAGNOSIS — IMO0002 Reserved for concepts with insufficient information to code with codable children: Secondary | ICD-10-CM | POA: Insufficient documentation

## 2013-01-25 DIAGNOSIS — J45909 Unspecified asthma, uncomplicated: Secondary | ICD-10-CM | POA: Insufficient documentation

## 2013-01-25 DIAGNOSIS — Z23 Encounter for immunization: Secondary | ICD-10-CM | POA: Insufficient documentation

## 2013-01-25 DIAGNOSIS — S39012A Strain of muscle, fascia and tendon of lower back, initial encounter: Secondary | ICD-10-CM

## 2013-01-25 DIAGNOSIS — S335XXA Sprain of ligaments of lumbar spine, initial encounter: Secondary | ICD-10-CM | POA: Insufficient documentation

## 2013-01-25 DIAGNOSIS — T07XXXA Unspecified multiple injuries, initial encounter: Secondary | ICD-10-CM

## 2013-01-25 DIAGNOSIS — Z79899 Other long term (current) drug therapy: Secondary | ICD-10-CM | POA: Insufficient documentation

## 2013-01-25 HISTORY — DX: Unspecified asthma, uncomplicated: J45.909

## 2013-01-25 MED ORDER — NAPROXEN 500 MG PO TABS: 500.0000 mg | ORAL_TABLET | Freq: Two times a day (BID) | ORAL | Status: AC

## 2013-01-25 MED ORDER — KETOROLAC TROMETHAMINE 60 MG/2ML IM SOLN
60.0000 mg | Freq: Once | INTRAMUSCULAR | Status: AC
  Administered 2013-01-25: 60 mg via INTRAMUSCULAR
  Filled 2013-01-25: qty 2

## 2013-01-25 MED ORDER — TETANUS-DIPHTH-ACELL PERTUSSIS 5-2.5-18.5 LF-MCG/0.5 IM SUSP
0.5000 mL | Freq: Once | INTRAMUSCULAR | Status: AC
  Administered 2013-01-25: 0.5 mL via INTRAMUSCULAR
  Filled 2013-01-25: qty 0.5
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
## 2013-01-25 NOTE — Progress Notes (Signed)
[facility] provided patient with a list of primary care resources in [place].  °
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
## 2013-01-28 NOTE — ED Provider Notes (Signed)
Medical screening examination/treatment/procedure(s) were performed by non-physician practitioner and as supervising physician I was immediately available for consultation/collaboration.   Laray Anger, DO 01/28/13 [phone]

## 2013-02-03 ENCOUNTER — Encounter (HOSPITAL_COMMUNITY): Payer: Self-pay | Admitting: *Deleted

## 2013-02-03 ENCOUNTER — Emergency Department (INDEPENDENT_AMBULATORY_CARE_PROVIDER_SITE_OTHER)
Admission: EM | Admit: 2013-02-03 | Discharge: 2013-02-03 | Disposition: A | Discharge status: Home / Self Care | Payer: Self-pay | Source: Home / Self Care | Attending: Emergency Medicine | Admitting: Emergency Medicine

## 2013-02-03 DIAGNOSIS — H60399 Other infective otitis externa, unspecified ear: Secondary | ICD-10-CM

## 2013-02-03 DIAGNOSIS — L748 Other eccrine sweat disorders: Secondary | ICD-10-CM

## 2013-02-03 DIAGNOSIS — H6002 Abscess of left external ear: Secondary | ICD-10-CM

## 2013-02-03 MED ORDER — CEFTRIAXONE SODIUM 1 G IJ SOLR
INTRAMUSCULAR | Status: AC
  Filled 2013-02-03: qty 10

## 2013-02-03 MED ORDER — CEPHALEXIN 500 MG PO CAPS: 500.0000 mg | ORAL_CAPSULE | Freq: Four times a day (QID) | ORAL | Status: DC

## 2013-02-03 MED ORDER — HYDROCODONE-ACETAMINOPHEN 5-325 MG PO TABS
1.0000 | ORAL_TABLET | ORAL | Status: AC | PRN
Start: 2013-02-03 — End: ?

## 2013-02-03 MED ORDER — LIDOCAINE HCL (PF) 1 % IJ SOLN: 2.0000 mL | Freq: Once | INTRAMUSCULAR | Status: DC

## 2013-02-03 MED ORDER — LIDOCAINE HCL (PF) 2 % IJ SOLN
2.0000 mL | Freq: Once | INTRAMUSCULAR | Status: AC
  Administered 2013-02-03: 2 mL

## 2013-02-03 MED ORDER — CEPHALEXIN 500 MG PO CAPS: 500.0000 mg | ORAL_CAPSULE | Freq: Four times a day (QID) | ORAL | Status: AC

## 2013-02-03 MED ORDER — HYDROCODONE-ACETAMINOPHEN 5-325 MG PO TABS: 1.0000 | ORAL_TABLET | ORAL | Status: DC | PRN

## 2013-02-03 MED ORDER — CEFTRIAXONE SODIUM 1 G IJ SOLR
1.0000 g | Freq: Once | INTRAMUSCULAR | Status: AC
  Administered 2013-02-03: 1 g via INTRAMUSCULAR

## 2013-02-03 MED ORDER — LIDOCAINE HCL (PF) 1 % IJ SOLN
INTRAMUSCULAR | Status: AC
  Filled 2013-02-03: qty 5

## 2013-02-03 NOTE — ED Notes (Signed)
S/S allergic reaction reviewed w/ pt. 

## 2013-02-03 NOTE — ED Provider Notes (Signed)
CSN: [id number]     Arrival date & time 02/03/13  1216 History   First MD Initiated Contact with Patient 02/03/13 1247     Chief Complaint  Patient presents with  . Abscess   (Consider location/radiation/quality/duration/timing/severity/associated sxs/prior Treatment) Patient is a 32 y.o. male presenting with abscess. The history is provided by the patient. No language interpreter was used.  Abscess Abscess location: LEFT EARLOBE. Size:  2 CM DIAMETER Abscess quality: fluctuance, painful and redness   Duration:  2 days Progression:  Worsening Pain details:    Quality:  Throbbing   Severity:  Moderate   Duration:  2 days   Timing:  Constant   Progression:  Unchanged Chronicity:  New Context: not diabetes, not immunosuppression, not injected drug use, not insect bite/sting and not skin injury   Relieved by:  Nothing Worsened by:  Nothing tried Ineffective treatments:  None tried Associated symptoms: no fever and no headaches   Risk factors: no hx of MRSA     Past Medical History  Diagnosis Date  . Bipolar 1 disorder   . Substance abuse   . Asthma    Past Surgical History  Procedure Laterality Date  . Tubes in ears     No family history on file. History  Substance Use Topics  . Smoking status: Current Every Day Smoker -- 0.50 packs/day    Types: Cigarettes  . Smokeless tobacco: Never Used  . Alcohol Use: No    Review of Systems  Constitutional: Negative.  Negative for fever.  HENT: Negative.   Eyes: Negative.   Respiratory: Negative.   Cardiovascular: Negative.   Gastrointestinal: Negative.   Endocrine: Negative.   Genitourinary: Negative.   Musculoskeletal: Negative.   Skin:       ABSCESS LEFT EARLOBE  Neurological: Negative.  Negative for headaches.  Hematological: Negative.   Psychiatric/Behavioral: Negative.   All other systems reviewed and are negative.    Allergies  Review of patient's allergies indicates no known allergies.  Home Medications    Current Outpatient Rx  Name  Route  Sig  Dispense  Refill  . ALBUTEROL IN   Inhalation   Inhale into the lungs.         . Multiple Vitamin (MULTIVITAMIN WITH MINERALS) TABS tablet   Oral   Take 1 tablet by mouth daily.         . cephALEXin (KEFLEX) 500 MG capsule   Oral   Take 1 capsule (500 mg total) by mouth 4 (four) times daily.   28 capsule   0   . HYDROcodone-acetaminophen (NORCO/VICODIN) 5-325 MG per tablet   Oral   Take 1 tablet by mouth every 4 (four) hours as needed for pain.   20 tablet   0   . naproxen (NAPROSYN) 500 MG tablet   Oral   Take 1 tablet (500 mg total) by mouth 2 (two) times daily.   20 tablet   0    BP 120/68  Pulse 88  Temp(Src) 98 F (36.7 C) (Oral)  Resp 16  SpO2 97% Physical Exam  Nursing note and vitals reviewed. Constitutional: He is oriented to person, place, and time. He appears well-developed and well-nourished.  HENT:  Head: Normocephalic and atraumatic.  Mouth/Throat: Oropharynx is clear and moist.  LEFT EAR=ABSCESS 2 CM DIAMETER INFERIOR EARLOBE  Eyes: Conjunctivae are normal. Pupils are equal, round, and reactive to light.  Neck: Normal range of motion. Neck supple.  Cardiovascular: Normal rate, regular rhythm, normal heart sounds and  intact distal pulses.   No murmur heard. Pulmonary/Chest: Effort normal and breath sounds normal.  Abdominal: Soft. Bowel sounds are normal. He exhibits no distension and no mass. There is no tenderness.  Musculoskeletal: Normal range of motion.  Neurological: He is alert and oriented to person, place, and time. No cranial nerve deficit. He exhibits normal muscle tone. Coordination normal.  Skin: Skin is warm and dry.  Psychiatric: He has a normal mood and affect.    ED Course  INCISION AND DRAINAGE Date/Time: 02/03/2013 1:07 PM Performed by: DE LAS ALAS, REUBEN M Authorized by: Eda Keys M Consent: Verbal consent obtained. written consent not obtained. Risks and benefits:  risks, benefits and alternatives were discussed Consent given by: patient Patient understanding: patient states understanding of the procedure being performed Patient consent: the patient's understanding of the procedure matches consent given Procedure consent: procedure consent matches procedure scheduled Site marked: the operative site was marked Patient identity confirmed: verbally with patient Type: abscess Location: LEFT EARLOBE. Anesthesia: local infiltration Local anesthetic: lidocaine 1% without epinephrine   (including critical care time) Labs Review Labs Reviewed  CULTURE, ROUTINE-ABSCESS   Imaging Review No results found.  MDM   1. Abscess, earlobe, left   2. Sweat gland cyst    OBTAINED PUS AND GREASY MATERIAL CONSISTENT WITH SEBACIOUS CYST; THE WHOLE SAC OF THE CYST WAS ALSO REMOVED; MILD CELLULITIS WAS NOTED    Duwayne Heck de Marcello Moores, MD 02/03/13 [phone]

## 2013-02-03 NOTE — ED Notes (Signed)
Triage/assessment performed per Dr. Ventura Bruns Alas.

## 2013-02-05 ENCOUNTER — Encounter (HOSPITAL_COMMUNITY): Payer: Self-pay | Admitting: Emergency Medicine

## 2013-02-05 ENCOUNTER — Emergency Department (INDEPENDENT_AMBULATORY_CARE_PROVIDER_SITE_OTHER)
Admission: EM | Admit: 2013-02-05 | Discharge: 2013-02-05 | Disposition: A | Discharge status: Home / Self Care | Payer: Self-pay | Source: Home / Self Care | Attending: Family Medicine | Admitting: Family Medicine

## 2013-02-05 DIAGNOSIS — H60399 Other infective otitis externa, unspecified ear: Secondary | ICD-10-CM

## 2013-02-05 DIAGNOSIS — H6002 Abscess of left external ear: Secondary | ICD-10-CM

## 2013-02-05 NOTE — ED Provider Notes (Signed)
CSN: [id number]     Arrival date & time 02/05/13  0847 History   First MD Initiated Contact with Patient 02/05/13 [phone]     No chief complaint on file.  (Consider location/radiation/quality/duration/timing/severity/associated sxs/prior Treatment) Patient is a 32 y.o. male presenting with wound check. The history is provided by the patient.  Wound Check This is a new problem. The current episode started 2 days ago. The problem has been gradually improving (still sore but feels improved from 8/31).    Past Medical History  Diagnosis Date  . Bipolar 1 disorder   . Substance abuse   . Asthma    Past Surgical History  Procedure Laterality Date  . Tubes in ears     No family history on file. History  Substance Use Topics  . Smoking status: Current Every Day Smoker -- 0.50 packs/day    Types: Cigarettes  . Smokeless tobacco: Never Used  . Alcohol Use: No    Review of Systems  Constitutional: Negative.   Skin: Positive for wound.    Allergies  Review of patient's allergies indicates no known allergies.  Home Medications   Current Outpatient Rx  Name  Route  Sig  Dispense  Refill  . ALBUTEROL IN   Inhalation   Inhale into the lungs.         . cephALEXin (KEFLEX) 500 MG capsule   Oral   Take 1 capsule (500 mg total) by mouth 4 (four) times daily.   28 capsule   0   . HYDROcodone-acetaminophen (NORCO/VICODIN) 5-325 MG per tablet   Oral   Take 1 tablet by mouth every 4 (four) hours as needed for pain.   20 tablet   0   . Multiple Vitamin (MULTIVITAMIN WITH MINERALS) TABS tablet   Oral   Take 1 tablet by mouth daily.         . naproxen (NAPROSYN) 500 MG tablet   Oral   Take 1 tablet (500 mg total) by mouth 2 (two) times daily.   20 tablet   0    BP 129/68  Pulse 66  Temp(Src) 98.8 F (37.1 C) (Oral)  Resp 16  SpO2 98% Physical Exam  Nursing note and vitals reviewed. Constitutional: He is oriented to person, place, and time. He appears well-developed  and well-nourished.  Neurological: He is alert and oriented to person, place, and time.  Skin: Skin is warm and dry.  dsg and packing removed, copious purulent fluid still expressed to clean base.    ED Course  Procedures (including critical care time) Labs Review Labs Reviewed - No data to display Imaging Review No results found.  MDM   1. Abscess of earlobe, left    packing removed, dsd applied.    Linna Hoff, MD 02/05/13 [phone]

## 2013-02-05 NOTE — ED Notes (Signed)
Seen by dr kindl prior to this nurse 

## 2013-02-06 LAB — CULTURE, ROUTINE-ABSCESS

## 2013-02-06 NOTE — ED Notes (Signed)
Abscess culture L ear: Few Diptheroids, (corynebacterium species)-no sensitivity for this organism.  Pt. had I and D and got Keflex. Lab shown to Dr. Artis Flock.  He saw pt. yesterday. He said it was cleared up. No further action needed. Vassie Moselle 02/06/2013

## 2013-07-04 ENCOUNTER — Encounter (HOSPITAL_COMMUNITY): Payer: Self-pay | Admitting: Emergency Medicine

## 2013-07-04 ENCOUNTER — Emergency Department (INDEPENDENT_AMBULATORY_CARE_PROVIDER_SITE_OTHER)
Admission: EM | Admit: 2013-07-04 | Discharge: 2013-07-04 | Disposition: A | Discharge status: Home / Self Care | Payer: Self-pay | Source: Home / Self Care | Attending: Family Medicine | Admitting: Family Medicine

## 2013-07-04 DIAGNOSIS — K029 Dental caries, unspecified: Secondary | ICD-10-CM

## 2013-07-04 MED ORDER — CLINDAMYCIN HCL 300 MG PO CAPS: 300.0000 mg | ORAL_CAPSULE | Freq: Three times a day (TID) | ORAL | Status: AC

## 2013-07-04 MED ORDER — DICLOFENAC POTASSIUM 50 MG PO TABS: 50.0000 mg | ORAL_TABLET | Freq: Three times a day (TID) | ORAL | Status: AC

## 2013-07-04 NOTE — ED Provider Notes (Signed)
CSN: [id number]     Arrival date & time 07/04/13  1706 History   First MD Initiated Contact with Patient 07/04/13 1737     Chief Complaint  Patient presents with  . Dental Pain   (Consider location/radiation/quality/duration/timing/severity/associated sxs/prior Treatment) Patient is a 33 y.o. male presenting with tooth pain. The history is provided by the patient.  Dental Pain Location:  Lower Lower teeth location:  19/LL 1st molar Quality:  Throbbing Severity:  Moderate Onset quality:  Gradual Duration:  3 days Progression:  Unchanged Chronicity:  New Context: dental caries and poor dentition   Associated symptoms: facial pain   Associated symptoms: no facial swelling   Risk factors: lack of dental care, periodontal disease and smoking     Past Medical History  Diagnosis Date  . Bipolar 1 disorder   . Substance abuse   . Asthma    Past Surgical History  Procedure Laterality Date  . Tubes in ears     History reviewed. No pertinent family history. History  Substance Use Topics  . Smoking status: Current Every Day Smoker -- 0.50 packs/day    Types: Cigarettes  . Smokeless tobacco: Never Used  . Alcohol Use: No    Review of Systems  Constitutional: Negative.   HENT: Positive for dental problem. Negative for facial swelling.     Allergies  Review of patient's allergies indicates no known allergies.  Home Medications   Current Outpatient Rx  Name  Route  Sig  Dispense  Refill  . ALBUTEROL IN   Inhalation   Inhale into the lungs.         . cephALEXin (KEFLEX) 500 MG capsule   Oral   Take 1 capsule (500 mg total) by mouth 4 (four) times daily.   28 capsule   0   . clindamycin (CLEOCIN) 300 MG capsule   Oral   Take 1 capsule (300 mg total) by mouth 3 (three) times daily.   21 capsule   0   . diclofenac (CATAFLAM) 50 MG tablet   Oral   Take 1 tablet (50 mg total) by mouth 3 (three) times daily. For tooth pain   15 tablet   0   .  HYDROcodone-acetaminophen (NORCO/VICODIN) 5-325 MG per tablet   Oral   Take 1 tablet by mouth every 4 (four) hours as needed for pain.   20 tablet   0   . Multiple Vitamin (MULTIVITAMIN WITH MINERALS) TABS tablet   Oral   Take 1 tablet by mouth daily.         . naproxen (NAPROSYN) 500 MG tablet   Oral   Take 1 tablet (500 mg total) by mouth 2 (two) times daily.   20 tablet   0    BP 130/74  Pulse 97  Temp(Src) 99.2 F (37.3 C) (Oral)  Resp 18  SpO2 97% Physical Exam  Nursing note and vitals reviewed. Constitutional: He is oriented to person, place, and time. He appears well-developed and well-nourished. He appears distressed.  HENT:  Mouth/Throat: Oropharynx is clear and moist and mucous membranes are normal. Abnormal dentition. Dental caries present. No dental abscesses.    Eyes: Conjunctivae are normal. Pupils are equal, round, and reactive to light.  Neck: Normal range of motion. Neck supple.  Lymphadenopathy:    He has cervical adenopathy.  Neurological: He is alert and oriented to person, place, and time.  Skin: Skin is warm and dry.    ED Course  Dental Date/Time: 07/04/2013 6:06 PM  Performed by: Linna Hoff Authorized by: Bradd Canary D Consent: Verbal consent obtained. Consent given by: patient Preparation: Patient was prepped and draped in the usual sterile fashion. Local anesthesia used: yes Local anesthetic: bupivacaine 0.5% with epinephrine Patient sedated: no Patient tolerance: Patient tolerated the procedure well with no immediate complications. Comments: Instant pain relief.   (including critical care time) Labs Review Labs Reviewed - No data to display Imaging Review No results found.    MDM  Dental n block effective in pain relief.    Linna Hoff, MD 07/04/13 [phone]

## 2013-07-04 NOTE — ED Notes (Signed)
C/o tooth ache for 3-4 days now States he chipped a tooth a few years ago on lower left side Sensitive to cold/hot items

## 2013-07-04 NOTE — Discharge Instructions (Signed)
Take medicine as prescribed, see your dentist as soon as possible °

## 2013-11-17 IMAGING — CT CT HEAD W/O CM
1 of 2 series · 13 of 30 positions shown, 17 images · non-contrast
Comparison: None.

CLINICAL DATA: Headache.

CT HEAD WITHOUT CONTRAST
TECHNIQUE: Contiguous axial images were obtained from the base of
the skull through the vertex without contrast.

[Series 2: brain · axial · 0.47mm/px · z∈[+129,+249]mm · 13 of 28 slices shown, 17 images]
[im 2/28  brain]
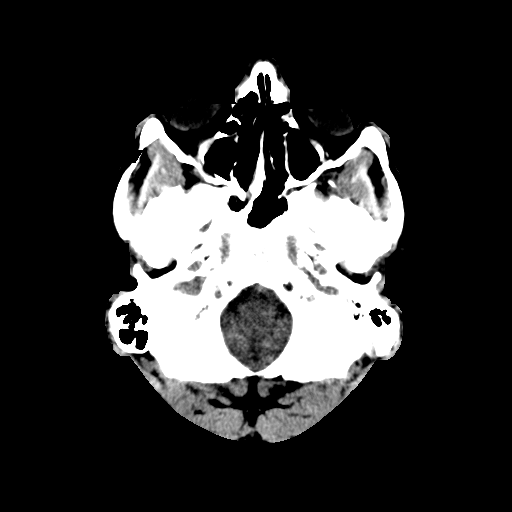
[im 2/28  bone]
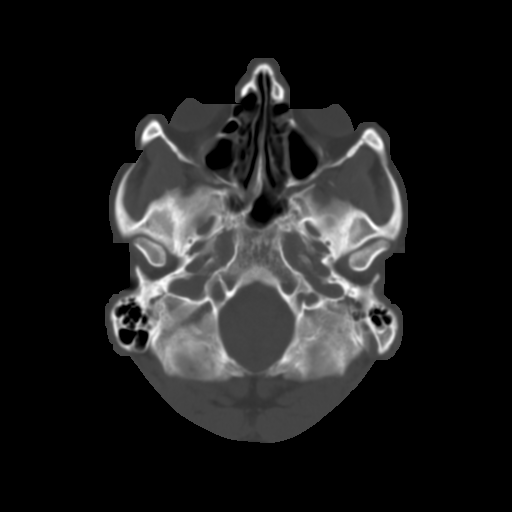
[im 4/28  brain]
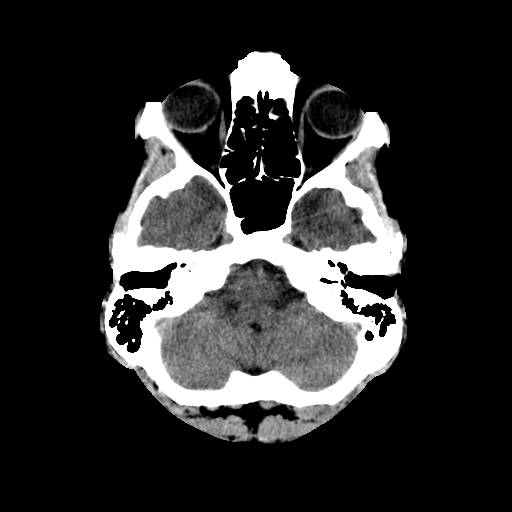
[im 6/28  brain]
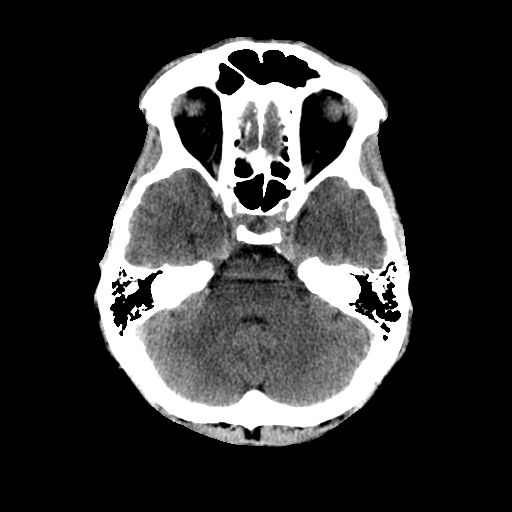
[im 8/28  brain]
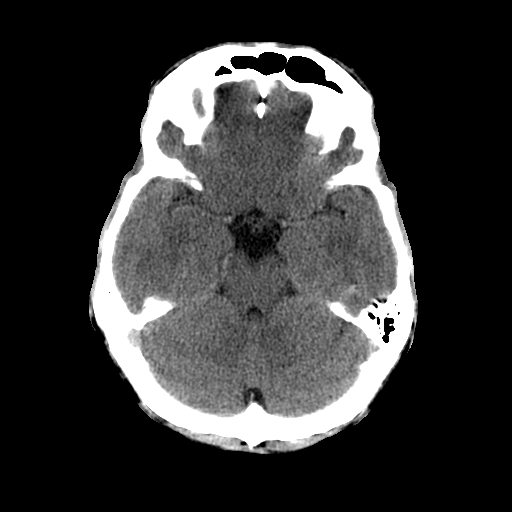
[im 10/28  brain]
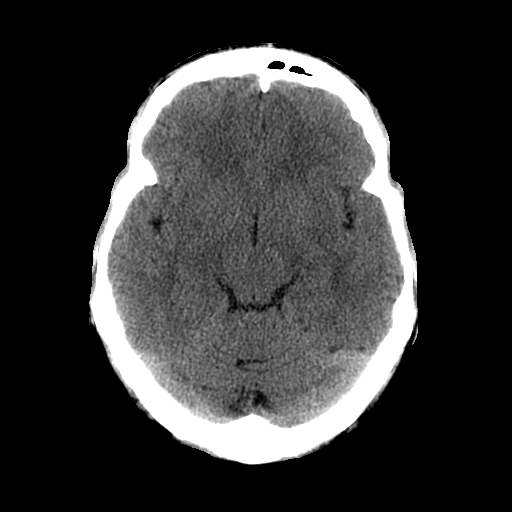
[im 10/28  bone]
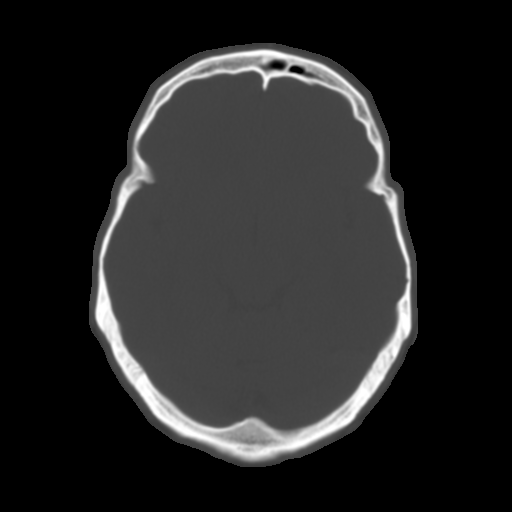
[im 12/28  brain]
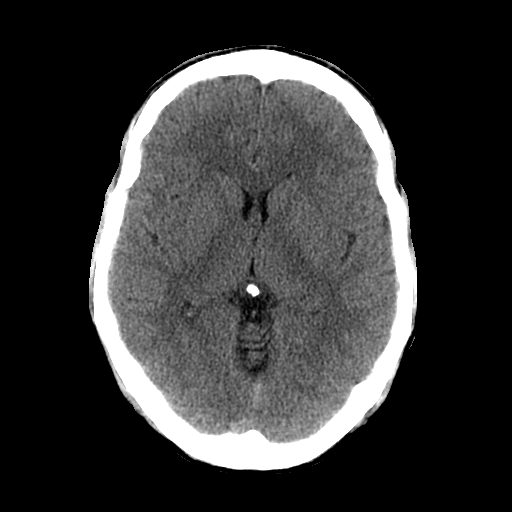
[im 14/28  brain]
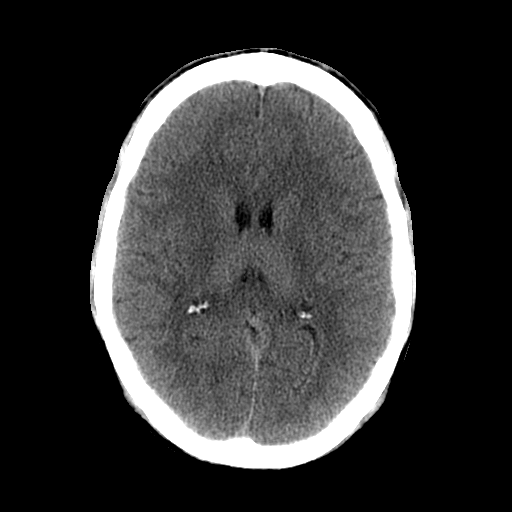
[im 16/28  brain]
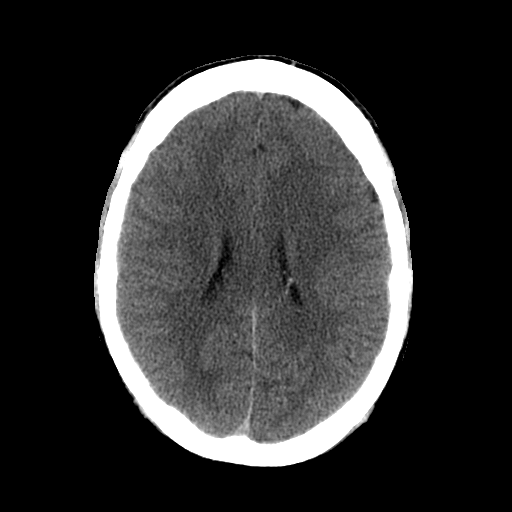
[im 18/28  brain]
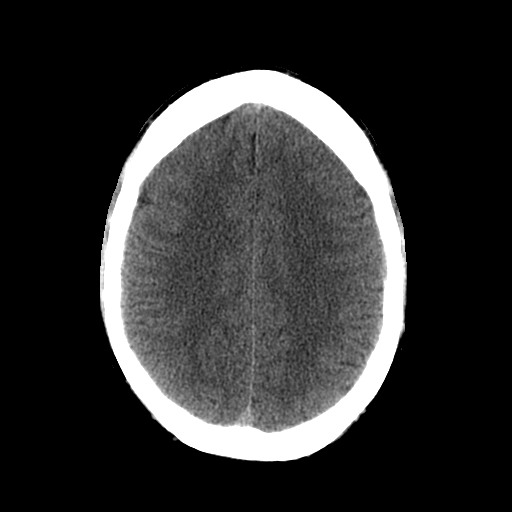
[im 18/28  bone]
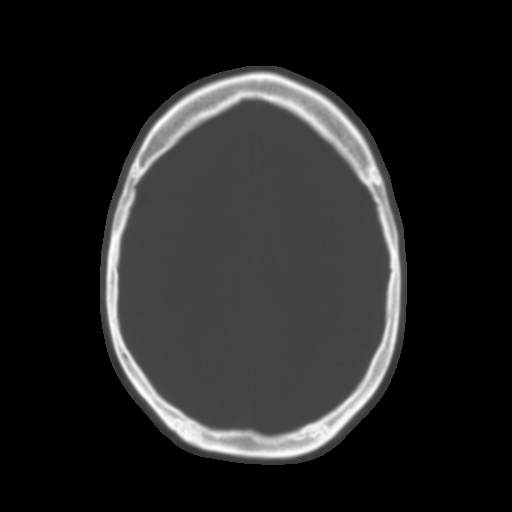
[im 20/28  brain]
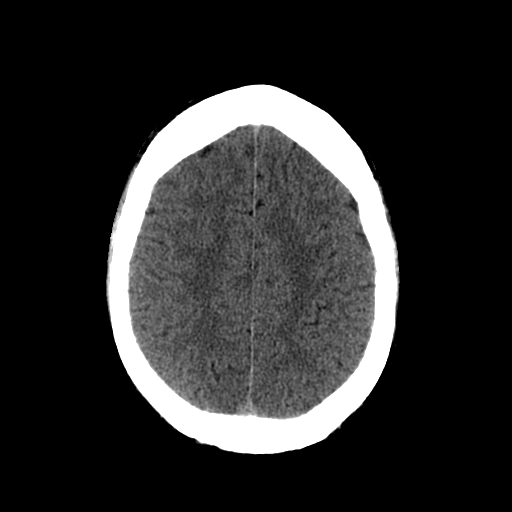
[im 22/28  brain]
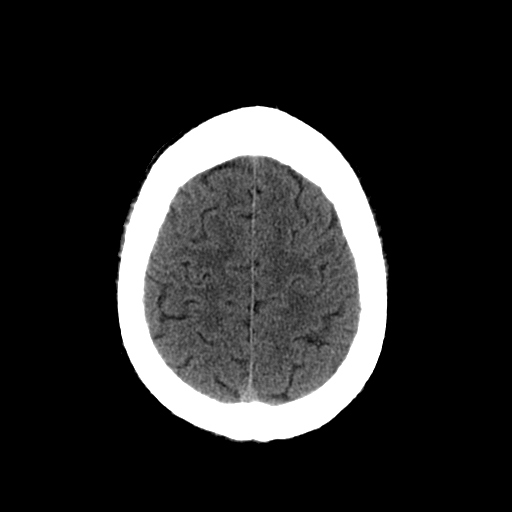
[im 24/28  brain]
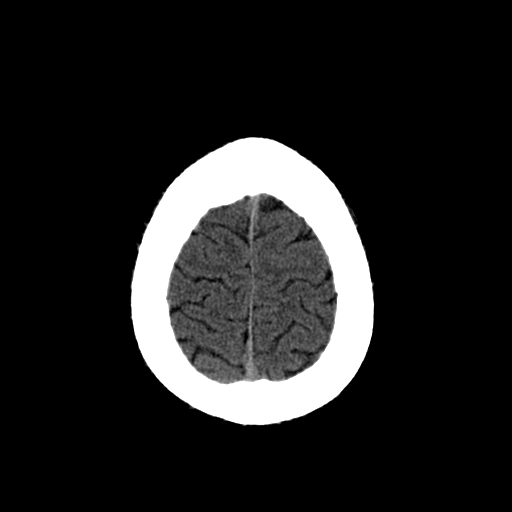
[im 26/28  brain]
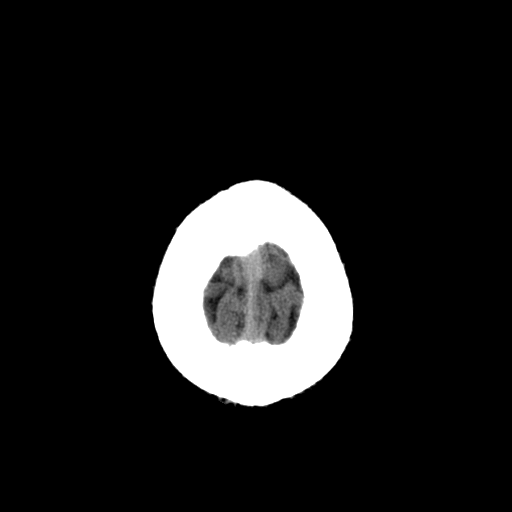
[im 26/28  bone]
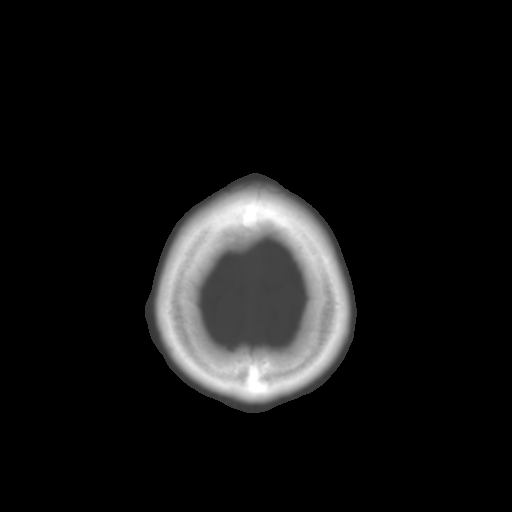

[13 of 30 positions shown; findings below may reference images not displayed]

FINDINGS: The brain appears normal without infarct, hemorrhage,
mass lesion, mass effect, midline shift or abnormal extra-axial
fluid collection.  Very mild ethmoid air cell disease is noted.  No
pneumocephalus or hydrocephalus.  Calvarium intact.
IMPRESSION: Negative exam.

## 2015-12-05 DEATH — deceased
